# Patient Record
Sex: Male | Born: 1940 | Race: White | Hispanic: No | Marital: Married | Smoking: Never smoker
Health system: Southern US, Community
[De-identification: ages and names within clinical notes are randomized; demographics above are authoritative.]

## PROBLEM LIST (undated history)

## (undated) DIAGNOSIS — I2699 Other pulmonary embolism without acute cor pulmonale: Secondary | ICD-10-CM

## (undated) DIAGNOSIS — C801 Malignant (primary) neoplasm, unspecified: Secondary | ICD-10-CM

## (undated) DIAGNOSIS — I10 Essential (primary) hypertension: Secondary | ICD-10-CM

## (undated) HISTORY — PX: PROSTATECTOMY: SHX69

---

## 2007-03-15 ENCOUNTER — Ambulatory Visit: Payer: Self-pay | Admitting: Emergency Medicine

## 2007-12-13 ENCOUNTER — Ambulatory Visit: Payer: Self-pay | Admitting: Unknown Physician Specialty

## 2009-02-04 ENCOUNTER — Ambulatory Visit: Payer: Self-pay | Admitting: Family Medicine

## 2009-07-29 ENCOUNTER — Ambulatory Visit: Payer: Self-pay | Admitting: Unknown Physician Specialty

## 2009-09-10 ENCOUNTER — Ambulatory Visit: Payer: Self-pay | Admitting: Unknown Physician Specialty

## 2011-10-12 ENCOUNTER — Ambulatory Visit: Payer: Self-pay | Admitting: Internal Medicine

## 2012-01-18 ENCOUNTER — Ambulatory Visit: Payer: Self-pay | Admitting: Family Medicine

## 2012-07-29 ENCOUNTER — Emergency Department: Payer: Self-pay | Admitting: Emergency Medicine

## 2013-01-11 IMAGING — CR RIGHT GREAT TOE
1 series · 3 of 3 positions shown · non-contrast
Comparison: none

REASON FOR EXAM: pain/swelling/bruising
COMMENTS:

PROCEDURE:     MDR - MDR TOE GREAT (1ST DIGIT)RIGHT  - January 18, 2012  [DATE]
RESULT:     Comparison:  None

[Series 1: ap · 0.17mm/px · 3 of 3 slices shown]
[im 1/3]
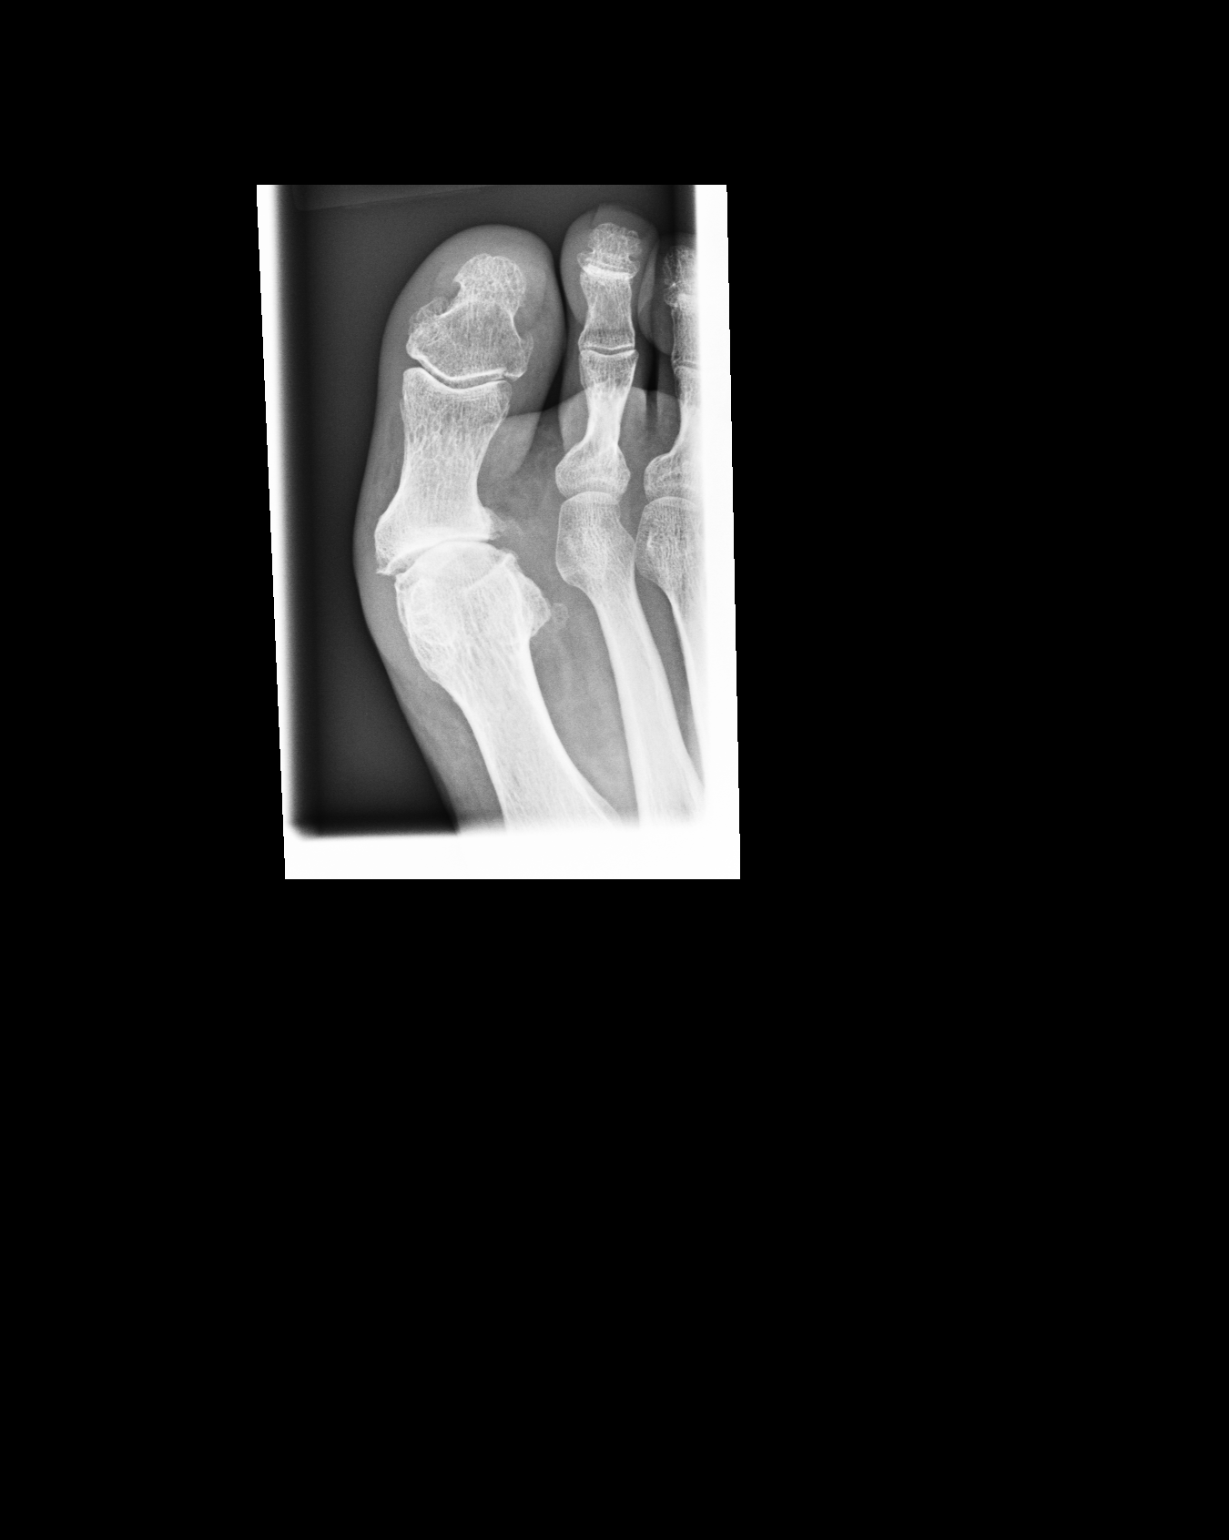
[im 2/3]
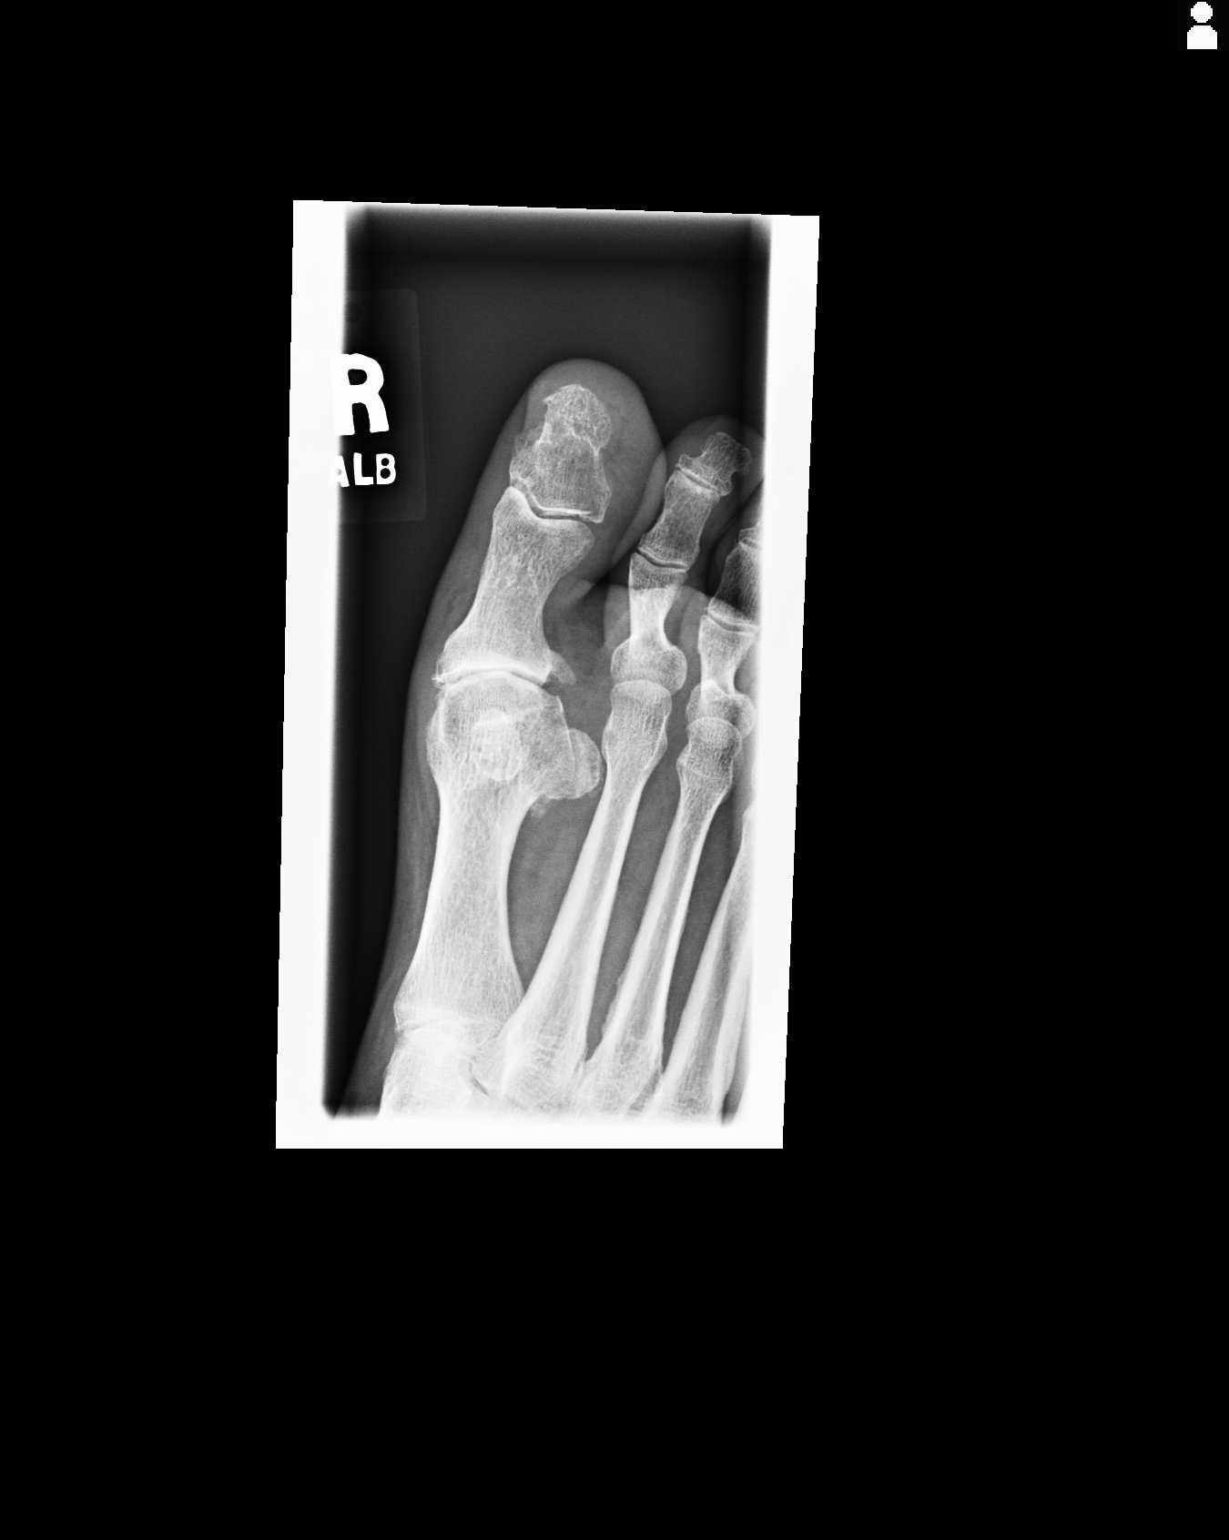
[im 3/3]
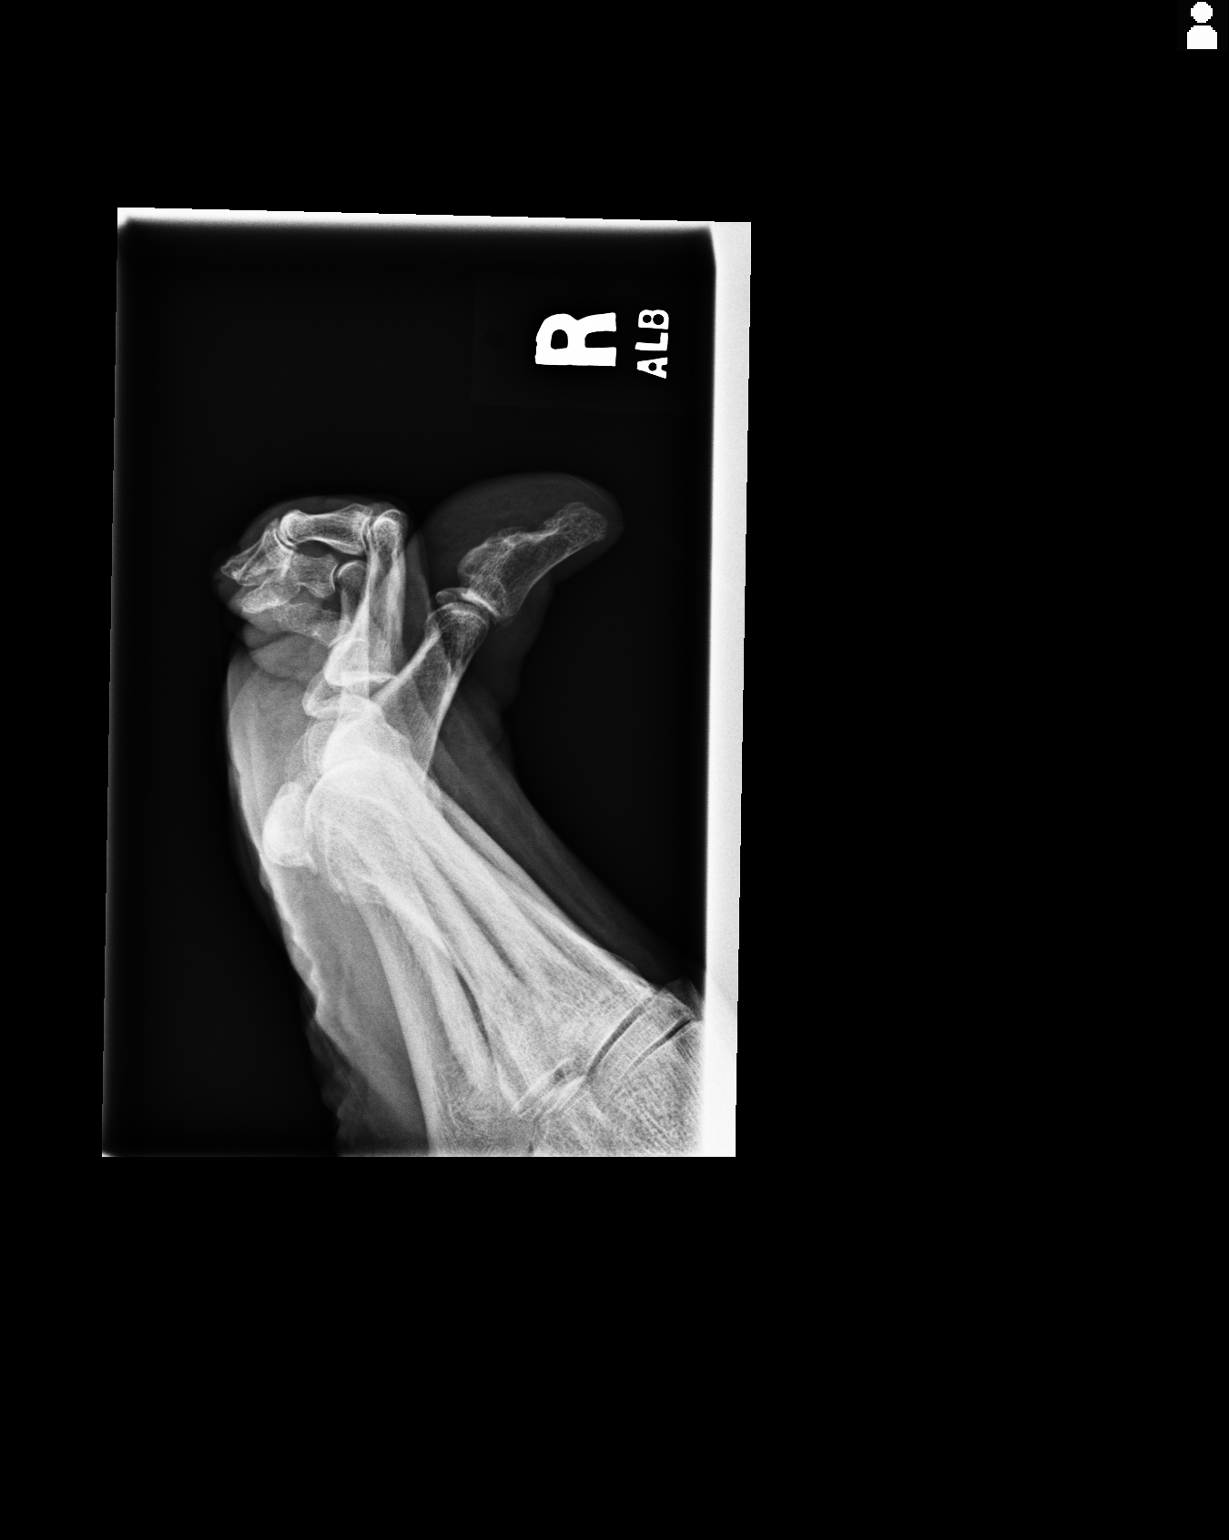

[3 of 3 positions shown; findings below may reference images not displayed]

FINDINGS: Three coned-down views of the right great toe demonstrates a nondisplaced
fracture along the lateral base of the first distal phalanx. There are
degenerative changes of the first MTP joint. There is hallux valgus of the
right foot. The soft tissues are normal.
IMPRESSION: Please see above.

[REDACTED]

## 2013-01-22 ENCOUNTER — Ambulatory Visit: Payer: Self-pay | Admitting: Urology

## 2013-05-09 ENCOUNTER — Inpatient Hospital Stay: Payer: Self-pay | Admitting: Internal Medicine

## 2013-05-09 LAB — CBC
HCT: 39.8 % — ABNORMAL LOW (ref 40.0–52.0)
HGB: 13.5 g/dL (ref 13.0–18.0)
MCH: 30 pg (ref 26.0–34.0)
MCHC: 34 g/dL (ref 32.0–36.0)
Platelet: 181 10*3/uL (ref 150–440)
RBC: 4.52 10*6/uL (ref 4.40–5.90)
RDW: 13.6 % (ref 11.5–14.5)
WBC: 11.6 10*3/uL — ABNORMAL HIGH (ref 3.8–10.6)

## 2013-05-09 LAB — URINALYSIS, COMPLETE
Bilirubin,UR: NEGATIVE
Glucose,UR: NEGATIVE mg/dL (ref 0–75)
Nitrite: NEGATIVE
Ph: 5 (ref 4.5–8.0)
Specific Gravity: 1.005 (ref 1.003–1.030)
WBC UR: 46 /HPF (ref 0–5)

## 2013-05-09 LAB — BASIC METABOLIC PANEL
Anion Gap: 4 — ABNORMAL LOW (ref 7–16)
Calcium, Total: 8.7 mg/dL (ref 8.5–10.1)
Chloride: 102 mmol/L (ref 98–107)
Co2: 30 mmol/L (ref 21–32)
EGFR (African American): >60
EGFR (Non-African Amer.): >60
Glucose: 91 mg/dL (ref 65–99)
Potassium: 3.5 mmol/L (ref 3.5–5.1)
Sodium: 136 mmol/L (ref 136–145)

## 2013-05-09 LAB — PRO B NATRIURETIC PEPTIDE: B-Type Natriuretic Peptide: 681 pg/mL — ABNORMAL HIGH (ref 0–125)

## 2013-05-10 LAB — CBC WITH DIFFERENTIAL/PLATELET
Basophil #: 0.1 10*3/uL (ref 0.0–0.1)
Lymphocyte #: 1.2 10*3/uL (ref 1.0–3.6)
MCHC: 33.9 g/dL (ref 32.0–36.0)
Monocyte #: 0.9 x10 3/mm (ref 0.2–1.0)
Monocyte %: 9.6 %
Neutrophil #: 6.6 10*3/uL — ABNORMAL HIGH (ref 1.4–6.5)
Platelet: 154 10*3/uL (ref 150–440)
RBC: 4.06 10*6/uL — ABNORMAL LOW (ref 4.40–5.90)
RDW: 13.5 % (ref 11.5–14.5)
WBC: 9.2 10*3/uL (ref 3.8–10.6)

## 2013-05-10 LAB — BASIC METABOLIC PANEL
Anion Gap: 4 — ABNORMAL LOW (ref 7–16)
BUN: 12 mg/dL (ref 7–18)
Calcium, Total: 8.5 mg/dL (ref 8.5–10.1)
Creatinine: 0.79 mg/dL (ref 0.60–1.30)
Glucose: 103 mg/dL — ABNORMAL HIGH (ref 65–99)
Osmolality: 278 (ref 275–301)
Potassium: 4 mmol/L (ref 3.5–5.1)

## 2013-05-10 LAB — APTT: Activated PTT: 43 secs — ABNORMAL HIGH (ref 23.6–35.9)

## 2013-05-10 LAB — PROTIME-INR
INR: 1.1
Prothrombin Time: 14.7 secs (ref 11.5–14.7)

## 2014-05-03 IMAGING — US US EXTREM LOW VENOUS BILAT
1 series · 14 of 24 positions shown · non-contrast
Comparison: none

REASON FOR EXAM: PE, right thigh pain, recent procedure
COMMENTS:

[Series 1: us extrem low venous bilat · 0.11mm/px · 14 of 44 slices shown]
[im 1/44]
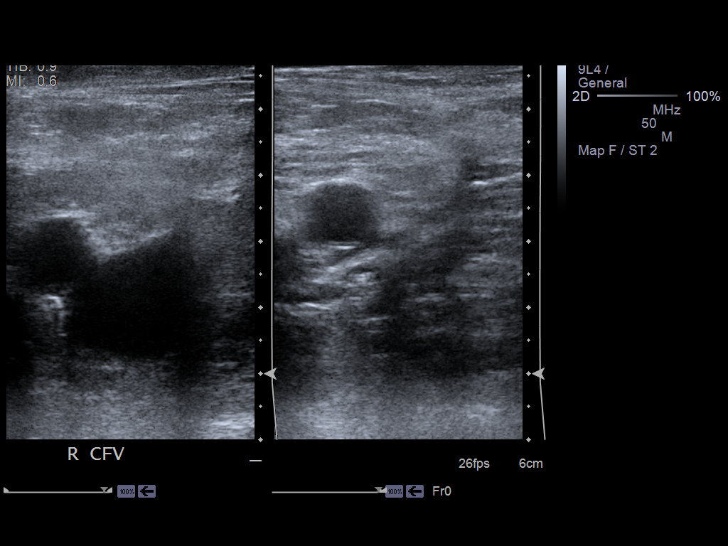
[im 4/44]
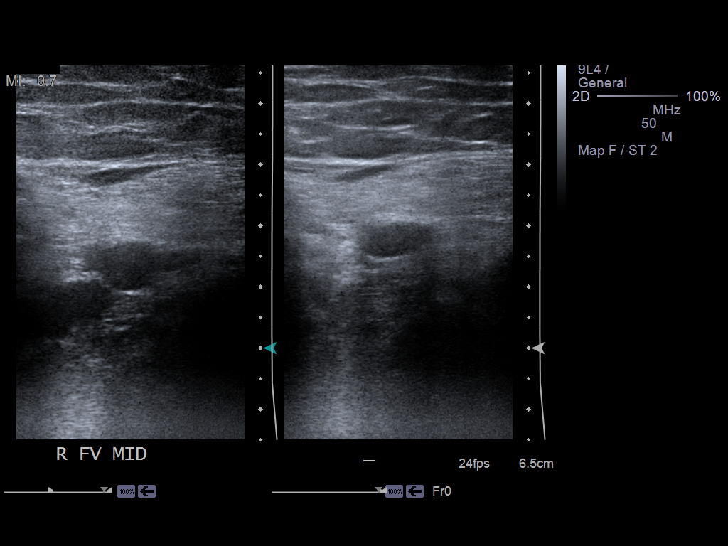
[im 8/44]
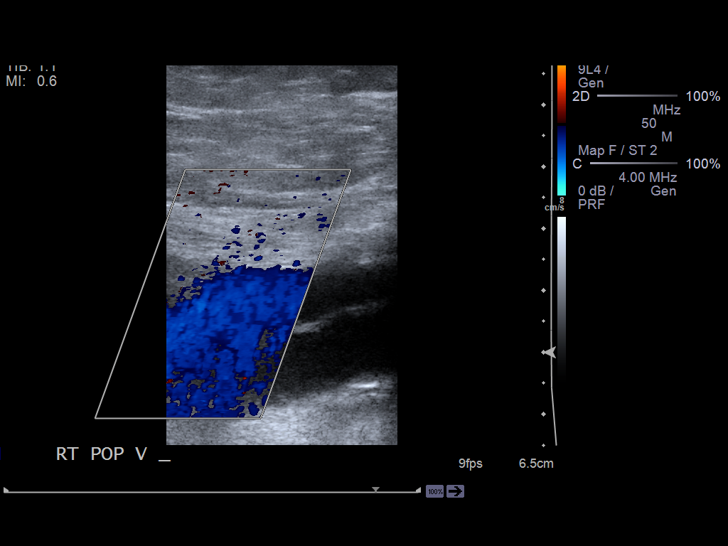
[im 12/44]
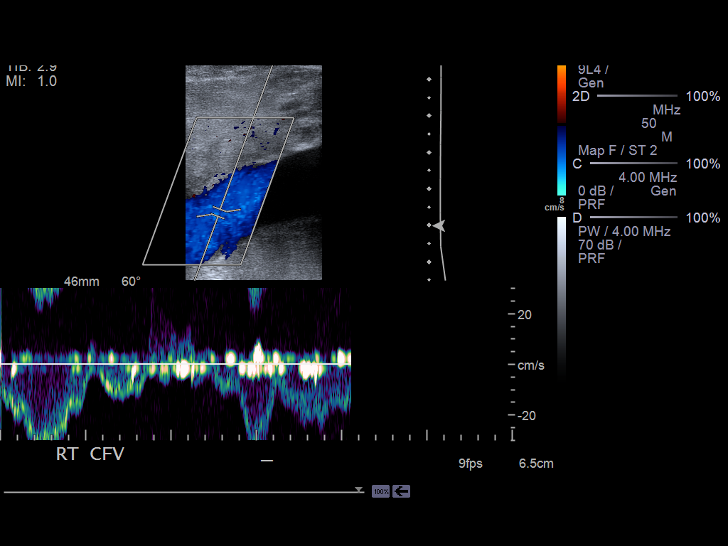
[im 14/44]
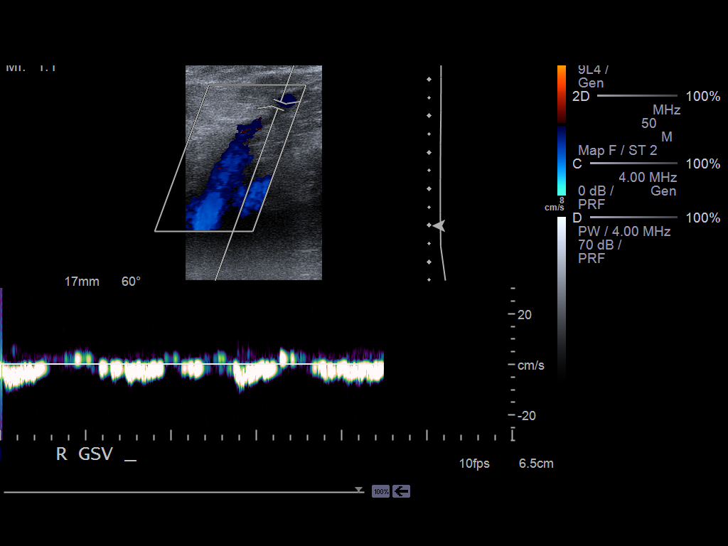
[im 17/44]
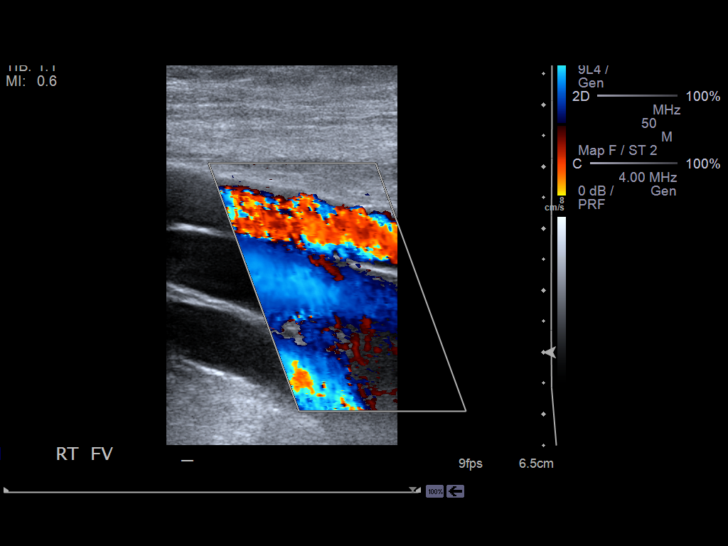
[im 21/44]
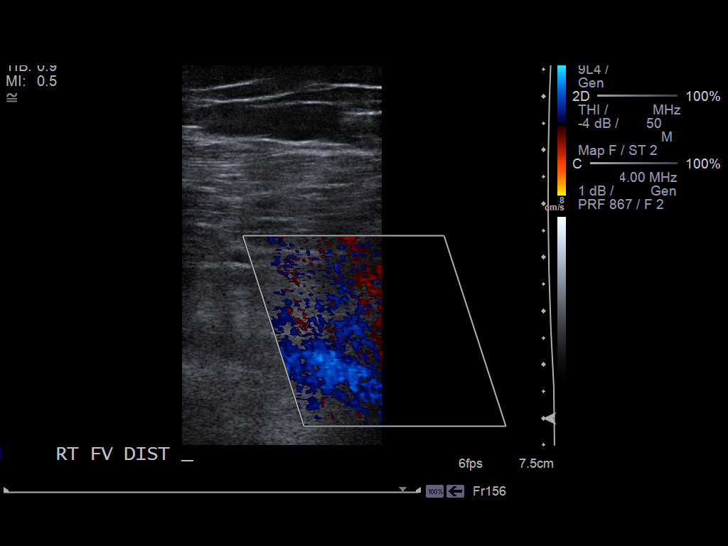
[im 23/44]
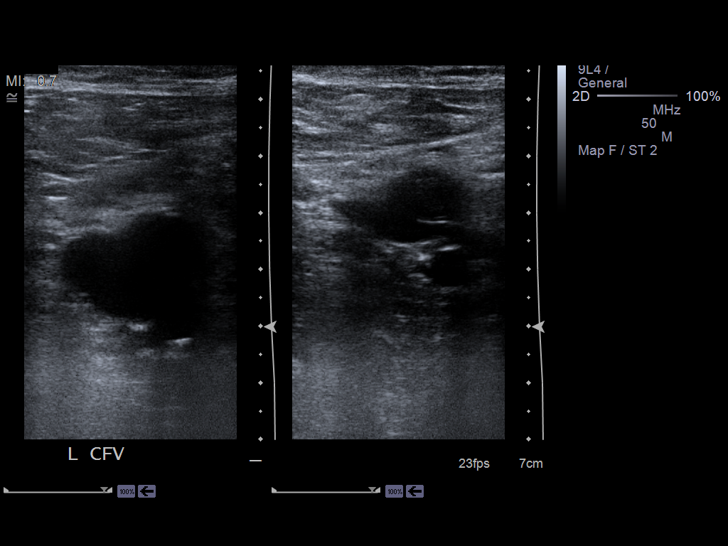
[im 27/44]
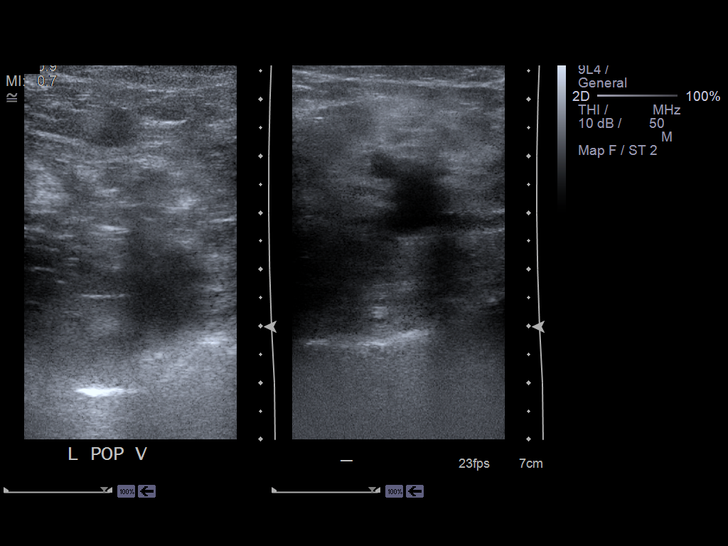
[im 30/44]
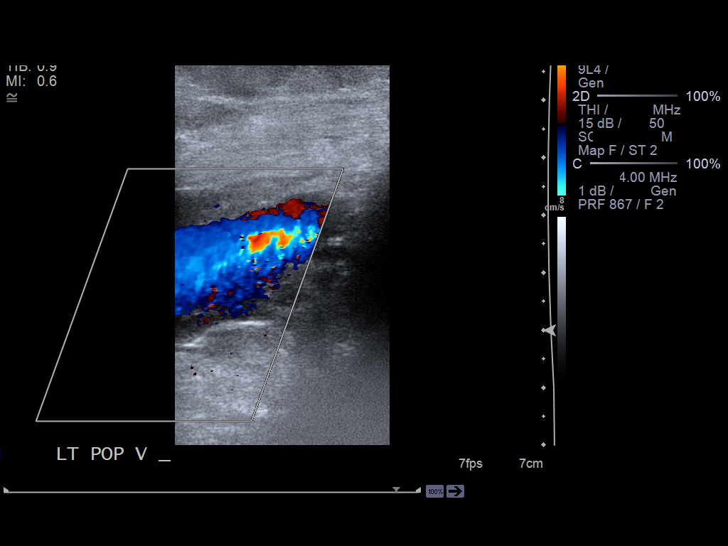
[im 34/44]
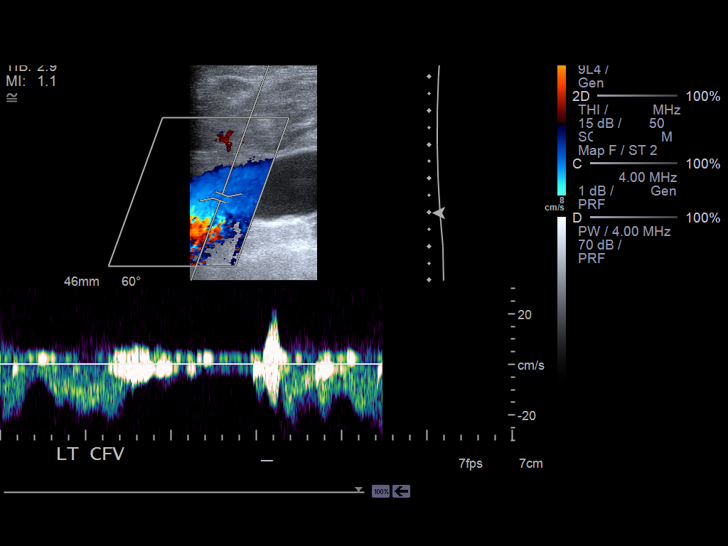
[im 36/44]
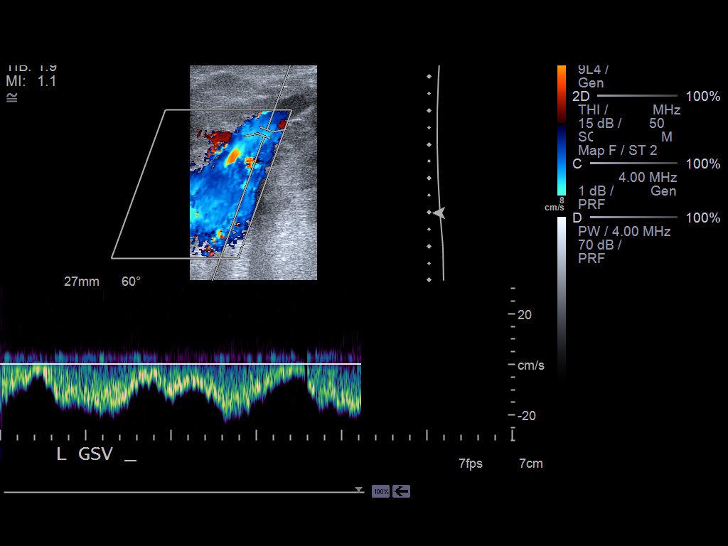
[im 40/44]
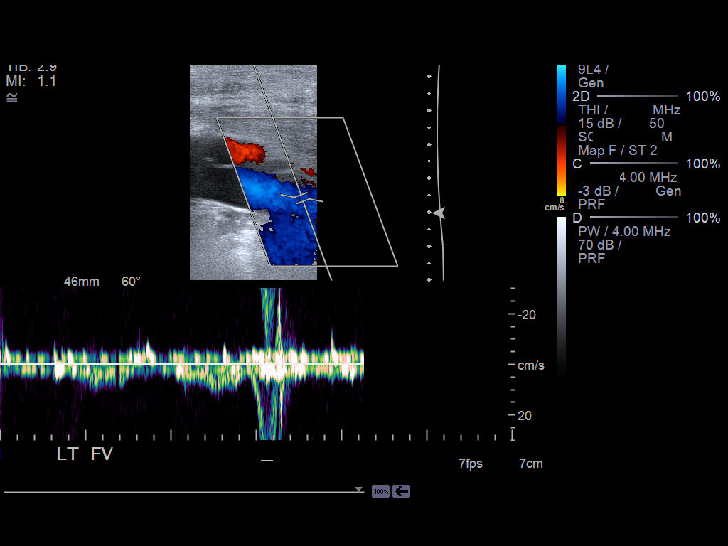
[im 44/44]
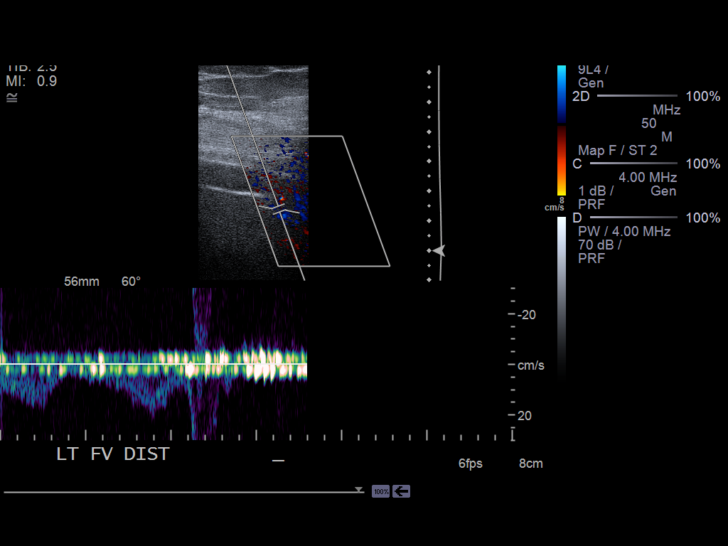

[14 of 24 positions shown; findings below may reference images not displayed]

PROCEDURE:     US  - US DOPPLER LOW EXTR BILATERAL  - May 09, 2013  [DATE]

RESULT:     Grayscale and color flow Doppler techniques were performed to
evaluate the deep veins of the right and left lower extremities.

The right common femoral, superficial femoral, and popliteal veins are
normally compressible. The waveform patterns are normal and the color flow
images are normal. The response to the augmentation and Valsalva maneuvers
is normal.
IMPRESSION: There is no evidence of thrombus within the right or left
femoral or popliteal veins.

[REDACTED]

## 2014-05-10 ENCOUNTER — Emergency Department: Payer: Self-pay | Admitting: Emergency Medicine

## 2014-05-10 LAB — BASIC METABOLIC PANEL
Anion Gap: 8 (ref 7–16)
BUN: 20 mg/dL — ABNORMAL HIGH (ref 7–18)
CALCIUM: 8.5 mg/dL (ref 8.5–10.1)
Chloride: 107 mmol/L (ref 98–107)
Co2: 29 mmol/L (ref 21–32)
Creatinine: 0.94 mg/dL (ref 0.60–1.30)
GLUCOSE: 89 mg/dL (ref 65–99)
OSMOLALITY: 289 (ref 275–301)
Potassium: 4.4 mmol/L (ref 3.5–5.1)
Sodium: 144 mmol/L (ref 136–145)

## 2014-05-10 LAB — CBC
HCT: 47.7 % (ref 40.0–52.0)
HGB: 15 g/dL (ref 13.0–18.0)
MCH: 27.4 pg (ref 26.0–34.0)
MCHC: 31.5 g/dL — ABNORMAL LOW (ref 32.0–36.0)
MCV: 87 fL (ref 80–100)
Platelet: 213 10*3/uL (ref 150–440)
RBC: 5.48 10*6/uL (ref 4.40–5.90)
RDW: 14.4 % (ref 11.5–14.5)
WBC: 6.7 10*3/uL (ref 3.8–10.6)

## 2014-05-10 LAB — TROPONIN I

## 2014-05-11 LAB — TROPONIN I

## 2014-05-11 LAB — PRO B NATRIURETIC PEPTIDE: B-TYPE NATIURETIC PEPTID: 96 pg/mL (ref 0–125)

## 2014-10-16 ENCOUNTER — Ambulatory Visit: Payer: Self-pay | Admitting: Unknown Physician Specialty

## 2015-01-16 NOTE — Discharge Summary (Signed)
PATIENT NAME:  Luke Richardson, Luke Richardson MR#:  [id number] DATE OF BIRTH:  August 02, 1941  DATE OF ADMISSION:  05/09/2013 DATE OF DISCHARGE:  05/10/2013   DISPOSITION: Discharged to [facility] August 15. Transfer is requested by Dr. Ardath Sax, head of urology, [facility].   PRESENTING COMPLAINT: Shortness of breath.  DISCHARGE DIAGNOSES:  1.  Acute pulmonary embolism. The patient is currently on heparin drip.  2.  History of prostate cancer, status post robotic prostate surgery a couple of days ago at [facility]. The patient has indwelling Foley catheter. 3.  Mild urinary tract infection secondary to indwelling Foley catheter and recent surgery. 4.   Depression.   CONDITION ON DISCHARGE: Fair. Vitals stable. Blood pressure is 134/72. Sats are 92% on 1 liter.   LABORATORY, DIAGNOSTIC AND RADIOLOGICAL DATA ON DISCHARGE: PTT is 39.2. White count is 9.2, hemoglobin and hematocrit 12.1 and 35.7, platelet count 154. Basic metabolic panel within normal limits. PT and INR are 14.7 and 1.1.   Ultrasound Doppler lower extremities: Negative for DVT.   CT of the chest shows tiny punctate pulmonary emboli noted in lower segmental pulmonary arteries. Mild atelectasis versus infiltrate in the lung bases.   EKG: Normal sinus rhythm.   UA positive for UTI.   BRIEF SUMMARY OF HOSPITAL COURSE: [name] is a pleasant 74 year old Caucasian gentleman who came into the Emergency Room after he started having shortness of breath. He recently underwent robotic surgery of his prostate 2 days ago. He is admitted with: 1.  Acute pulmonary embolism in one of the smaller lower segmental arteries, associated with probably his cancer/underlying procedure. The patient was started on IV heparin drip. Urinary bleeding was monitored closely. Spoke with Dr. Lyndel Pleasure  at [facility] and he is recommending transfer to [facility]. The patient otherwise is hemodynamically stable. He will be transferred down to [facility] with  IV heparin drip.  2.  Prostate cancer: Follows at [facility]. Recently had laparoscopic resection. Has Foley for now. Continue Cipro for 3 more days.  3.  Depression: Continue Zoloft.  4.  Gastrointestinal prophylaxis: On Protonix.    CODE STATUS: The patient is a full code.  TIME SPENT: 40 minutes.   ____________________________ Hart Rochester Posey Pronto, MD sap:jm D: 05/10/2013 15:19:00 ET T: 05/10/2013 15:33:21 ET JOB#: [id number]  cc: Semira Stoltzfus A. Posey Pronto, MD, <Dictator> Ilda Basset MD ELECTRONICALLY SIGNED 05/30/2013 20:00

## 2015-01-16 NOTE — H&P (Signed)
PATIENT NAME:  Luke Richardson, Luke Richardson MR#:  [id number] DATE OF BIRTH:  [birth year]  DATE OF ADMISSION:  05/09/2013  ADMITTING PHYSICIAN: Luke Lighter, MD  PRIMARY CARE PHYSICIAN: Luke Fairly, MD from [facility] in [place]: Difficulty breathing.   HISTORY OF PRESENT ILLNESS: Luke Richardson is a 74 year old Caucasian male with past medical history significant for benign prostatic hypertrophy and recently diagnosed with prostate cancer, status post prostate resection done by robotic surgery 2 days ago at [facility], comes to the hospital secondary to difficulty breathing going on since yesterday. The patient also had some chest discomfort, and his dyspnea was progressively worsening, so presented to the Emergency Room. CT of the chest done here showed small pulmonary emboli which could be causing her dyspnea, but because of his recent procedure, [facility] was contacted about anticoagulation, and they recommended starting a heparin drip, which is short-acting, in case the patient has any bleeding rather than Lovenox or oral anticoagulation at this time; so, the patient is being admitted for pulmonary emboli requiring IV heparin drip. He still appears slightly dyspneic  requiring 2 liters oxygen at this time.   PAST MEDICAL HISTORY: 1.  Recently diagnosed with prostate cancer in 11/2012, nonmetastatic, and had resection done.  2.  Barrett's esophagus.  3.  Depression.   PAST SURGICAL HISTORY:  1.  Transurethral resection of prostate done 10 years ago for BPH.  2.  Hernia surgery.  3.  Laparoscopic robotic resection of the prostate done 2 days ago.   ALLERGIES TO MEDICATIONS: No known drug allergies.   CURRENT HOME MEDICATIONS:  1.  Ativan 0.5 mg p.o. 3 times a day as needed for anxiety, nervousness.  2.  Celebrex 200 mg p.o. b.i.d.  3.  Cialis 20 mg every orally every 3 days.  4.  Ciprofloxacin 500 mg p.o. b.i.d. for 3 days.  5.  Colace 100 mg p.o. b.i.d.  6.  Nexium 40 mg p.o. daily.  7.   Percocet 5/325 mg 1 to 2 tablets every 4 hours as needed for pain.  8.  Senokot 1 tablet daily.  9.  Sertraline 50 mg p.o. daily.  10.  Flomax 0.4 mg p.o. daily.   SOCIAL HISTORY: Lives at home with his wife. No smoking or alcohol use. He is retired from [employer] working in the past.   FAMILY HISTORY: Father with colon cancer. Mom with high blood pressure.   REVIEW OF SYSTEMS:   CONSTITUTIONAL: No fever, fatigue or weakness.  EYES: No blurred vision, double vision, glaucoma, inflammation or cataracts.  Uses glasses.  ENT: No tinnitus, ear pain, hearing loss, epistaxis or discharge.  RESPIRATORY: No cough, wheeze, hemoptysis or COPD. Positive for dyspnea.  CARDIOVASCULAR: Positive for chest tightness, no orthopnea, edema, arrhythmia, palpitations or syncope.  GASTROINTESTINAL: No nausea, vomiting, abdominal pain, hematemesis or melena.  GENITOURINARY: No dysuria, hematuria. Has Foley catheter placed in 2 days ago from his surgery.  ENDOCRINE: No polyuria, nocturia, thyroid problems, heat or cold intolerance.  HEMATOLOGY: No anemia, easy bruising or bleeding.  SKIN: No acne, rash or lesions.  MUSCULOSKELETAL: No neck, back, shoulder pain, arthritis or gout.  NEUROLOGIC: No numbness, weakness, CVA, TIA or seizures.  PSYCHOLOGICAL: No anxiety, insomnia, depression.   PHYSICAL EXAMINATION: VITAL SIGNS: Temperature 97.2 degrees Fahrenheit, pulse 78, respirations 20, blood pressure 118/71, pulse ox 90% on room air and 94% on 2 liters nasal cannula.  GENERAL: Well-built, well-nourished male sitting in bed, mild respiratory distress secondary to dyspnea, especially on minimal exertion.  HEENT: Normocephalic, atraumatic. Pupils equal, round, reacting to light. Anicteric sclerae. Extraocular movements intact. Oropharynx clear without erythema, mass or exudates.  NECK: Supple. No thyromegaly, JVD or carotid bruits. No lymphadenopathy.  LUNGS: Moving air bilaterally. No wheeze or rhonchi. There  are fine crackles heard at the left base posteriorly. Minimal use of accessory muscles only on exertion.  CARDIOVASCULAR: S1 and S2. Regular rate and rhythm. No murmurs, rubs or gallops.  ABDOMEN: Soft, nontender, nondistended. No hepatosplenomegaly. Normal bowel sounds. He  still has the laparoscopic incisions present in the abdomen with minimal tenderness around, especially the one with a larger incision which had a drain, is in the right lower quadrant.  EXTREMITIES: No pedal edema. No clubbing or cyanosis, 2+ dorsalis pedis pulses palpable bilaterally.  SKIN: No acne, rash or lesions.  LYMPHATICS: No cervical lymphadenopathy.  NEUROLOGIC: Cranial nerves II through XII are intact. Motor strength is 5 out of 5 in all 4 extremities and 2+ symmetric deep tendon reflexes both upper and lower extremities, and sensation is intact.  PSYCHOLOGICAL: The patient is awake, alert, oriented x 3.   LABORATORY AND RADIOLOGICAL DATA:  His WBC is 11.6, hemoglobin 13.3, hematocrit 39.8, platelet count 181.  Sodium 136, potassium 3.5, chloride 102, bicarb 30, BUN12, creatinine 0.78, glucose 91 and calcium of 8.7. Urinalysis with 3+ leukocyte esterase, 2+ bacteria, 4 to 6 WBCs present. BNP slightly elevated at 681. Troponin is negative.  Chest x-ray present showing clear lung fields. No acute disease of the chest.   CT of the chest for PE showing tiny punctate pulmonary emboli noted in the lower segmental pulmonary arteries. Minimal atelectasis versus infiltrates in the lung bases, tiny pleural effusions and cardiomegaly seen. Large airways are patent. Right-sided nephrolithiasis cannot be excluded.   ASSESSMENT AND PLAN: A 74 year old male with past medical history significant for prostate cancer with recent resection of prostate done 2 days ago at [facility] laparoscopically, comes secondary to dyspnea and chest discomfort, found to have pulmonary embolism.   1.  Acute pulmonary embolism, probably associated with his  cancer, underlying procedure. We will start on heparin drip at this time because it is short-acting, and in case the patient has any bleeding we will need to stop it.  Monitor closely at this time. We will hold off on starting Coumadin tonight. I will get a Doppler of the lower extremities and continue oxygen support as needed.  2.  Prostate cancer, follows at [facility]. He had a recent laparoscopic resection done, has Foley for now, and continues Cipro as recommended for 66more days. Urine looks dirty secondary to his Foley catheter.  3.  Depression. Continue Zoloft.  4.  Gastrointestinal prophylaxis on Protonix.   CODE STATUS: FULL CODE.   TIME SPENT ON ADMISSION: 50 minutes.    ____________________________ Luke Lighter, MD rk:cb D: 05/09/2013 17:25:38 ET T: 05/09/2013 17:53:33 ET JOB#: [id number]  cc: Luke Lighter, MD, <Dictator> Hall Busing. Petra Kuba, MD Luke Lighter MD ELECTRONICALLY SIGNED 05/20/2013 22:12

## 2017-06-13 ENCOUNTER — Encounter: Payer: Self-pay | Admitting: *Deleted

## 2017-06-13 ENCOUNTER — Ambulatory Visit
Admission: EM | Admit: 2017-06-13 | Discharge: 2017-06-13 | Disposition: A | Discharge status: Home / Self Care | Payer: Medicare HMO | Attending: Family Medicine | Admitting: Family Medicine

## 2017-06-13 DIAGNOSIS — S99922A Unspecified injury of left foot, initial encounter: Secondary | ICD-10-CM

## 2017-06-13 HISTORY — DX: Essential (primary) hypertension: I10

## 2017-06-13 HISTORY — DX: Other pulmonary embolism without acute cor pulmonale: I26.99

## 2017-06-13 HISTORY — DX: Malignant (primary) neoplasm, unspecified: C80.1

## 2017-06-13 NOTE — Discharge Instructions (Signed)
Follow up with Podiatrist

## 2017-06-13 NOTE — ED Triage Notes (Signed)
Pt injured left big toe 1 year ago and that toenail has been "black" since that injury. Pt doing house work today and realized left toenail had been pulled loose and was bleeding. Bleeding now controlled.

## 2017-10-14 NOTE — ED Provider Notes (Signed)
[facility]    CSN: [id number] Arrival date & time: 06/13/17  1342     History   Chief Complaint Chief Complaint  Patient presents with  . Nail Problem    HPI Luke Richardson is a 77 y.o. male.   77 yo male with a c/o left big toe toenail loose. States he injured it 1 year ago and the toenail became black. Today while doing housework he noticed some bleeding and notice toenail was loose.    The history is provided by the patient.    Past Medical History:  Diagnosis Date  . Cancer ([place])   . Hypertension   . Pulmonary embolism ([facility])     There are no active problems to display for this patient.   Past Surgical History:  Procedure Laterality Date  . PROSTATECTOMY         Home Medications    Prior to Admission medications   Medication Sig Start Date End Date Taking? Authorizing Provider  esomeprazole (NEXIUM) 20 MG capsule Take 20 mg by mouth daily at 12 noon.   Yes [provider]  furosemide (LASIX) 40 MG tablet Take 40 mg by mouth.   Yes [provider]  rivaroxaban (XARELTO) 20 MG TABS tablet Take 20 mg by mouth daily with supper.   Yes [provider]    Family History History reviewed. No pertinent family history.  Social History Social History   Tobacco Use  . Smoking status: Never Smoker  . Smokeless tobacco: Never Used  Substance Use Topics  . Alcohol use: No  . Drug use: No     Allergies   Patient has no known allergies.   Review of Systems Review of Systems   Physical Exam Triage Vital Signs ED Triage Vitals  Enc Vitals Group     BP 06/13/17 1429 126/64     Pulse Rate 06/13/17 1429 (!) 55     Resp 06/13/17 1429 16     Temp 06/13/17 1429 98.3 F (36.8 C)     Temp Source 06/13/17 1429 Oral     SpO2 06/13/17 1429 95 %     Weight --      Height 06/13/17 1430 6' (1.829 m)     Head Circumference --      Peak Flow --      Pain Score 06/13/17 1533 0     Pain Loc --      Pain Edu? --    Excl. in [place]? --    No data found.  Updated Vital Signs BP 126/64 (BP Location: Left Arm)   Pulse (!) 55   Temp 98.3 F (36.8 C) (Oral)   Resp 16   Ht 6' (1.829 m)   SpO2 95%   Visual Acuity Right Eye Distance:   Left Eye Distance:   Bilateral Distance:    Right Eye Near:   Left Eye Near:    Bilateral Near:     Physical Exam  Constitutional: He appears well-developed and well-nourished. No distress.  Skin: He is not diaphoretic.  Left big toe toenail slightly lifted but not completely avulsed; no active bleeding  Nursing note and vitals reviewed.    UC Treatments / Results  Labs (all labs ordered are listed, but only abnormal results are displayed) Labs Reviewed - No data to display  EKG  EKG Interpretation None       Radiology No results found.  Procedures Procedures (including critical care time)  Medications Ordered in UC  Medications - No data to display   Initial Impression / Assessment and Plan / UC Course  I have reviewed the triage vital signs and the nursing notes.  Pertinent labs & imaging results that were available during my care of the patient were reviewed by me and considered in my medical decision making (see chart for details).       Final Clinical Impressions(s) / UC Diagnoses   Final diagnoses:  Injury of toenail of left foot, initial encounter    ED Discharge Orders    None     1. diagnosis reviewed with patient 2. Recommend supportive treatment with wound care, bandaging nail to hold in place  4. Follow-up with podiatrist for further evaluation, possible toenail removal  Controlled Substance Prescriptions [place] Controlled Substance Registry consulted? Not Applicable   Norval Gable, MD 10/14/17 [phone]

## 2022-06-26 DEATH — deceased
